# Patient Record
Sex: Male | Born: 1985 | Race: White | Hispanic: No | Marital: Single | Smoking: Current every day smoker
Health system: Southern US, Community
[De-identification: ages and names within clinical notes are randomized; demographics above are authoritative.]

## PROBLEM LIST (undated history)

## (undated) DIAGNOSIS — H5711 Ocular pain, right eye: Secondary | ICD-10-CM

## (undated) DIAGNOSIS — G43909 Migraine, unspecified, not intractable, without status migrainosus: Secondary | ICD-10-CM

## (undated) DIAGNOSIS — H538 Other visual disturbances: Secondary | ICD-10-CM

## (undated) HISTORY — DX: Ocular pain, right eye: H57.11

## (undated) HISTORY — PX: OTHER SURGICAL HISTORY: SHX169

## (undated) HISTORY — DX: Migraine, unspecified, not intractable, without status migrainosus: G43.909

## (undated) HISTORY — DX: Other visual disturbances: H53.8

---
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
## 2006-04-01 ENCOUNTER — Emergency Department (HOSPITAL_COMMUNITY): Admission: EM | Admit: 2006-04-01 | Discharge: 2006-04-02 | Payer: Self-pay | Admitting: Emergency Medicine

## 2007-05-17 ENCOUNTER — Emergency Department (HOSPITAL_COMMUNITY): Admission: EM | Admit: 2007-05-17 | Discharge: 2007-05-17 | Payer: Self-pay | Admitting: Emergency Medicine

## 2008-11-26 ENCOUNTER — Ambulatory Visit (HOSPITAL_COMMUNITY): Admission: RE | Admit: 2008-11-26 | Discharge: 2008-11-26 | Payer: Self-pay | Admitting: Family Medicine

## 2010-09-23 ENCOUNTER — Emergency Department (HOSPITAL_COMMUNITY)
Admission: EM | Admit: 2010-09-23 | Discharge: 2010-09-23 | Disposition: A | Discharge status: Home / Self Care | Payer: Self-pay | Attending: Emergency Medicine | Admitting: Emergency Medicine

## 2010-09-23 DIAGNOSIS — IMO0002 Reserved for concepts with insufficient information to code with codable children: Secondary | ICD-10-CM | POA: Insufficient documentation

## 2010-09-23 DIAGNOSIS — F17200 Nicotine dependence, unspecified, uncomplicated: Secondary | ICD-10-CM | POA: Insufficient documentation

## 2010-09-25 ENCOUNTER — Emergency Department (HOSPITAL_COMMUNITY)
Admission: EM | Admit: 2010-09-25 | Discharge: 2010-09-25 | Disposition: A | Discharge status: Home / Self Care | Payer: Self-pay | Attending: Emergency Medicine | Admitting: Emergency Medicine

## 2010-09-25 DIAGNOSIS — Z4800 Encounter for change or removal of nonsurgical wound dressing: Secondary | ICD-10-CM | POA: Insufficient documentation

## 2010-09-25 DIAGNOSIS — IMO0002 Reserved for concepts with insufficient information to code with codable children: Secondary | ICD-10-CM | POA: Insufficient documentation

## 2011-02-01 LAB — COMPREHENSIVE METABOLIC PANEL
ALT: 15
Alkaline Phosphatase: 68
BUN: 11
CO2: 29
Calcium: 9.3
GFR calc non Af Amer: >60
Glucose, Bld: 134 — ABNORMAL HIGH
Potassium: 4.2
Sodium: 138
Total Protein: 6.7

## 2011-02-01 LAB — LIPASE, BLOOD: Lipase: 43

## 2011-10-23 ENCOUNTER — Emergency Department (HOSPITAL_COMMUNITY): Payer: BC Managed Care – PPO

## 2011-10-23 ENCOUNTER — Encounter (HOSPITAL_COMMUNITY): Payer: Self-pay | Admitting: *Deleted

## 2011-10-23 ENCOUNTER — Emergency Department (HOSPITAL_COMMUNITY)
Admission: EM | Admit: 2011-10-23 | Discharge: 2011-10-23 | Disposition: A | Discharge status: Home / Self Care | Payer: BC Managed Care – PPO | Attending: Emergency Medicine | Admitting: Emergency Medicine

## 2011-10-23 DIAGNOSIS — F17200 Nicotine dependence, unspecified, uncomplicated: Secondary | ICD-10-CM | POA: Insufficient documentation

## 2011-10-23 DIAGNOSIS — R1013 Epigastric pain: Secondary | ICD-10-CM | POA: Insufficient documentation

## 2011-10-23 DIAGNOSIS — R109 Unspecified abdominal pain: Secondary | ICD-10-CM

## 2011-10-23 LAB — CBC
HCT: 45.6 % (ref 39.0–52.0)
MCHC: 34.4 g/dL (ref 30.0–36.0)
RDW: 13.1 % (ref 11.5–15.5)
WBC: 8.1 10*3/uL (ref 4.0–10.5)

## 2011-10-23 LAB — DIFFERENTIAL
Basophils Absolute: 0 10*3/uL (ref 0.0–0.1)
Basophils Relative: 0 % (ref 0–1)
Lymphocytes Relative: 30 % (ref 12–46)
Monocytes Absolute: 0.8 10*3/uL (ref 0.1–1.0)
Neutro Abs: 4.7 10*3/uL (ref 1.7–7.7)
Neutrophils Relative %: 58 % (ref 43–77)

## 2011-10-23 LAB — COMPREHENSIVE METABOLIC PANEL
ALT: 14 U/L (ref 0–53)
AST: 18 U/L (ref 0–37)
Albumin: 4.1 g/dL (ref 3.5–5.2)
Alkaline Phosphatase: 72 U/L (ref 39–117)
CO2: 26 mEq/L (ref 19–32)
Chloride: 101 mEq/L (ref 96–112)
GFR calc non Af Amer: >90 mL/min (ref >90)
Potassium: 4.1 mEq/L (ref 3.5–5.1)
Sodium: 136 mEq/L (ref 135–145)
Total Bilirubin: 0.4 mg/dL (ref 0.3–1.2)

## 2011-10-23 MED ORDER — GI COCKTAIL ~~LOC~~
30.0000 mL | Freq: Once | ORAL | Status: AC
  Administered 2011-10-23: 30 mL via ORAL
  Filled 2011-10-23: qty 30

## 2011-10-23 MED ORDER — PANTOPRAZOLE SODIUM 20 MG PO TBEC: 20.0000 mg | DELAYED_RELEASE_TABLET | Freq: Every day | ORAL | Status: DC

## 2011-10-23 NOTE — Discharge Instructions (Signed)
No caffeine,  No alcohol, no cigarettes,   Follow up for recheck in 1-2 weeks.  Follow up with either dr. Haskell Riling, or Dr. Janna Arch

## 2011-10-23 NOTE — ED Notes (Signed)
Pt presents with epigastric pain x 2 months that has significantly worsened over past 4 days. Pain refered to as severe at times that doubles him over, however he can "work through it " at times. Pt is sitting in bedside chair at this time. NAD noted.  No prior history of abdominal pain.  Pt denies N/V and fever.

## 2011-10-23 NOTE — ED Notes (Signed)
Patient transported to X-ray via wheelchair 

## 2011-10-23 NOTE — ED Notes (Signed)
Upper abd pain for 1 year.constant for 3 days. Nausea /vomiting on Friday. With diarrhea.    Now only nausea.  Headache.

## 2011-10-23 NOTE — ED Provider Notes (Signed)
History  This chart was scribed for Sergio Lennert, MD by Sergio Richardson. This patient was seen in room APA09/APA09 and the patient's care was started at 12:34PM.  CSN: [id number]  Arrival date & time 10/23/11  1127   First MD Initiated Contact with Patient 10/23/11 1234      Chief Complaint  Patient presents with  . Abdominal Pain    Patient is a 26 y.o. male presenting with abdominal pain. The history is provided by the patient. No language interpreter was used.  Abdominal Pain The primary symptoms of the illness include abdominal pain, nausea (Resolved), vomiting (Resolved) and diarrhea (Resolved). The primary symptoms of the illness do not include fatigue. The current episode started more than 2 days ago. The onset of the illness was gradual. The problem has been gradually worsening.  The abdominal pain is located in the epigastric region. The abdominal pain does not radiate. The severity of the abdominal pain is 10/10.  Symptoms associated with the illness do not include hematuria, frequency or back pain. Significant associated medical issues do not include GERD, inflammatory bowel disease or diabetes.    Sergio Richardson is a 26 y.o. male who presents to the Emergency Department complaining of 3 days of gradual onset, gradual worsening constant epigastric pain described as irritation. He states that the intensity fluctuates between mild and severe.  He reports prior episodes of similar symptoms since last year. He states that they come one a couple of times every three months and  last between 30 minutes to one hour at a time before resolving on their own. He states that this episode is more severe then last time. He reports that distracting himself with physical tasks improves the pain. He denies modifying factors that aggravate the pain. He denies that eating improves or worsens the pain. He reports that he has taken acid reducers and Zantac without improvement in his symptoms. He denies  trying ibuprofen or tylenol for his pain. He denies having any known causes. He has not seen a MD prior to today's visit for the pain. He denies any major abdominal surgeries. He reports one episode of nausea, emesis, and diarrhea 4 days ago but states that these symptoms have reolved. He also c/o HAs located in the right temple that he states are brought on by the epigastric pain. He states that bright lights and loud noises make the HAs worse. He reports that laying down and "sleeping it off" improves the HAs. He reports taking Excedrin and goody powders with moderate improvement in the HA. He denies fever, cough and congestion as associated symptoms. He denies having a h/o chronic medical conditions. He is a current everyday smoker and occasional alcohol user.  History reviewed. No pertinent past medical history.  History reviewed. No pertinent past surgical history.  Family History  Problem Relation Age of Onset  . Diabetes Brother     History  Substance Use Topics  . Smoking status: Current Everyday Smoker-0.5 pack per day  . Smokeless tobacco: Not on file  . Alcohol Use: Yes- weekends       Review of Systems  Constitutional: Negative for fatigue.  HENT: Negative for congestion, sinus pressure and ear discharge.   Eyes: Negative for discharge.  Respiratory: Negative for cough.   Cardiovascular: Negative for chest pain.  Gastrointestinal: Positive for nausea (Resolved), vomiting (Resolved), abdominal pain and diarrhea (Resolved).  Genitourinary: Negative for frequency and hematuria.  Musculoskeletal: Negative for back pain.  Skin: Negative for rash.  Neurological: Positive for headaches. Negative for seizures.  Hematological: Negative.   Psychiatric/Behavioral: Negative for hallucinations.    Allergies  Review of patient's allergies indicates no known allergies.  Home Medications   Current Outpatient Rx  Name Route Sig Dispense Refill  . ASPIRIN-ACETAMINOPHEN-CAFFEINE  500-325-65 MG PO PACK Oral Take 1 packet by mouth every 6 (six) hours as needed. For pain      Triage Vitals: BP 150/95  Pulse 99  Temp(Src) 98.1 F (36.7 C) (Oral)  Resp 18  Ht 6\' 2"  (1.88 m)  Wt 183 lb (83.008 kg)  BMI 23.50 kg/m2  SpO2 98%  Physical Exam  Nursing note and vitals reviewed. Constitutional: He is oriented to person, place, and time. He appears well-developed.  HENT:  Head: Normocephalic and atraumatic.  Eyes: Conjunctivae and EOM are normal. No scleral icterus.  Neck: Neck supple. No thyromegaly present.  Cardiovascular: Normal rate and regular rhythm.  Exam reveals no gallop and no friction rub.   No murmur heard. Pulmonary/Chest: No stridor. He has no wheezes. He has no rales. He exhibits no tenderness.  Abdominal: Soft. He exhibits no distension. There is tenderness (mild epigastric tenderness). There is no rebound.  Musculoskeletal: Normal range of motion. He exhibits no edema.  Lymphadenopathy:    He has no cervical adenopathy.  Neurological: He is oriented to person, place, and time. Coordination normal.  Skin: No rash noted. No erythema.  Psychiatric: He has a normal mood and affect. His behavior is normal.    ED Course  Procedures (including critical care time)  DIAGNOSTIC STUDIES: Oxygen Saturation is 98% on room air, normal by my interpretation.    COORDINATION OF CARE: 12:41PM-Discussed treatment plan which includes x-ray and blood work with pt and pt agreed to plan.   Labs Reviewed - No data to display No results found.   No diagnosis found.    MDM   The chart was scribed for me under my direct supervision.  I personally performed the history, physical, and medical decision making and all procedures in the evaluation of this patient.Sergio Lennert, MD 10/23/11 1504

## 2012-01-23 ENCOUNTER — Emergency Department (HOSPITAL_COMMUNITY): Payer: BC Managed Care – PPO

## 2012-01-23 ENCOUNTER — Emergency Department (HOSPITAL_COMMUNITY)
Admission: EM | Admit: 2012-01-23 | Discharge: 2012-01-23 | Disposition: A | Discharge status: Home / Self Care | Payer: BC Managed Care – PPO | Attending: Emergency Medicine | Admitting: Emergency Medicine

## 2012-01-23 ENCOUNTER — Encounter (HOSPITAL_COMMUNITY): Payer: Self-pay

## 2012-01-23 DIAGNOSIS — E119 Type 2 diabetes mellitus without complications: Secondary | ICD-10-CM | POA: Insufficient documentation

## 2012-01-23 DIAGNOSIS — F17200 Nicotine dependence, unspecified, uncomplicated: Secondary | ICD-10-CM | POA: Insufficient documentation

## 2012-01-23 DIAGNOSIS — M25569 Pain in unspecified knee: Secondary | ICD-10-CM | POA: Insufficient documentation

## 2012-01-23 DIAGNOSIS — M25562 Pain in left knee: Secondary | ICD-10-CM

## 2012-01-23 NOTE — ED Provider Notes (Signed)
History  This chart was scribed for Donnetta Hutching, MD by Shari Heritage. The patient was seen in room APA03/APA03. Patient's care was started at 0723.     CSN: [id number]  Arrival date & time 01/23/12  4098   First MD Initiated Contact with Patient 01/23/12 [phone]      Chief Complaint  Patient presents with  . Knee Pain    The history is provided by the patient. No language interpreter was used.   Sergio Richardson is a 26 y.o. male who presents to the Emergency Department complaining of constant, moderate, non-radiating left knee pain resulting from an injury sustained while playing softball 3 days ago. Patient injured his knee while diving for a ball. He states that pain worsens while walking and climbing stairs. Patient reports no other significant pain or symptoms. 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 He reports no other significant medical or surgical history. He is a current everyday smoker.   Family History  Problem Relation Age of Onset  . Diabetes Brother     History  Substance Use Topics  . Smoking status: Current Every Day Smoker  . Smokeless tobacco: Not on file  . Alcohol Use: Yes      Review of Systems A complete 10 system review of systems was obtained and all systems are negative except as noted in the HPI and PMH.   Allergies  Review of patient's allergies indicates no known allergies.  Home Medications   Current Outpatient Rx  Name Route Sig Dispense Refill  . ASPIRIN-ACETAMINOPHEN-CAFFEINE 500-325-65 MG PO PACK Oral Take 1 packet by mouth every 6 (six) hours as needed. For pain    . PANTOPRAZOLE SODIUM 20 MG PO TBEC Oral Take 1 tablet (20 mg total) by mouth daily. 30 tablet 1    BP 131/83  Pulse 71  Temp 98 F (36.7 C) (Oral)  Resp 18  Ht 6\' 2"  (1.88 m)  Wt 185 lb (83.915 kg)  BMI 23.75 kg/m2  SpO2 98%  Physical Exam  Nursing note and vitals reviewed. Constitutional: He is oriented to person,  place, and time. He appears well-developed and well-nourished.  HENT:  Head: Normocephalic and atraumatic.  Eyes: Conjunctivae normal and EOM are normal. Pupils are equal, round, and reactive to light.  Musculoskeletal: Normal range of motion.       Left upper leg: He exhibits tenderness.       Superior lateral aspect of knee is most tender. Pain is worse with flexion.  Neurological: He is alert and oriented to person, place, and time.  Skin: Skin is warm and dry.  Psychiatric: He has a normal mood and affect.    ED Course  Procedures (including critical care time) DIAGNOSTIC STUDIES: Oxygen Saturation is 98% on room air, normal by my interpretation.    COORDINATION OF CARE: 7:31am- Patient informed of current plan for treatment and evaluation and agrees with plan at this time. Will refer patient to on-call orthopedic surgeon for follow-up. Instructed patient to rest knee and apply ice.  Dg Knee Complete 4 Views Left  01/23/2012  *RADIOLOGY REPORT*  Clinical Data: Left-sided knee pain.  LEFT KNEE - COMPLETE 4+ VIEW  Comparison: No priors.  Findings: Four views of the left knee demonstrate no acute fracture, subluxation or dislocation.  There is some mild prepatellar soft tissue swelling.  IMPRESSION: 1.  Mild prepatellar soft tissue swelling without evidence of underlying bony trauma.   Original Report  Authenticated By: Florencia Reasons, M.D.      No diagnosis found.    MDM  X-ray shows no fracture. Ice, elevate, knee brace, refer to orthopedic surgeon. Possibility of MRI discussed.       I personally performed the services described in this documentation, which was scribed in my presence. The recorded information has been reviewed and considered.    Donnetta Hutching, MD 01/23/12 [phone]

## 2012-01-23 NOTE — ED Notes (Signed)
Pt reports was playing softball Sunday and dove for a ball.  Pt c/o pain to left knee.  Abrasion noted.

## 2012-11-27 ENCOUNTER — Emergency Department (HOSPITAL_COMMUNITY)
Admission: EM | Admit: 2012-11-27 | Discharge: 2012-11-27 | Disposition: A | Discharge status: Home / Self Care | Payer: BC Managed Care – PPO | Attending: Emergency Medicine | Admitting: Emergency Medicine

## 2012-11-27 ENCOUNTER — Encounter (HOSPITAL_COMMUNITY): Payer: Self-pay | Admitting: *Deleted

## 2012-11-27 DIAGNOSIS — R112 Nausea with vomiting, unspecified: Secondary | ICD-10-CM | POA: Insufficient documentation

## 2012-11-27 DIAGNOSIS — G8921 Chronic pain due to trauma: Secondary | ICD-10-CM | POA: Insufficient documentation

## 2012-11-27 DIAGNOSIS — R220 Localized swelling, mass and lump, head: Secondary | ICD-10-CM | POA: Insufficient documentation

## 2012-11-27 DIAGNOSIS — F17200 Nicotine dependence, unspecified, uncomplicated: Secondary | ICD-10-CM | POA: Insufficient documentation

## 2012-11-27 MED ORDER — DEXAMETHASONE SODIUM PHOSPHATE 4 MG/ML IJ SOLN
10.0000 mg | Freq: Once | INTRAMUSCULAR | Status: AC
  Administered 2012-11-27: 10 mg via INTRAVENOUS
  Filled 2012-11-27: qty 3

## 2012-11-27 MED ORDER — SODIUM CHLORIDE 0.9 % IV SOLN
1000.0000 mL | Freq: Once | INTRAVENOUS | Status: AC
  Administered 2012-11-27: 1000 mL via INTRAVENOUS

## 2012-11-27 MED ORDER — SODIUM CHLORIDE 0.9 % IV SOLN
1000.0000 mL | INTRAVENOUS | Status: DC
  Administered 2012-11-27: 19:00:00 via INTRAVENOUS

## 2012-11-27 MED ORDER — METOCLOPRAMIDE HCL 5 MG/ML IJ SOLN
10.0000 mg | Freq: Once | INTRAMUSCULAR | Status: AC
  Administered 2012-11-27: 10 mg via INTRAVENOUS
  Filled 2012-11-27: qty 2

## 2012-11-27 MED ORDER — DIPHENHYDRAMINE HCL 50 MG/ML IJ SOLN
25.0000 mg | Freq: Once | INTRAMUSCULAR | Status: AC
  Administered 2012-11-27: 25 mg via INTRAVENOUS
  Filled 2012-11-27: qty 1

## 2012-11-27 MED ORDER — ISOMETHEPTENE-APAP-DICHLORAL 65-325-100 MG PO CAPS: ORAL_CAPSULE | ORAL | Status: DC
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
## 2013-02-06 ENCOUNTER — Encounter: Payer: Self-pay | Admitting: Neurology

## 2013-02-06 ENCOUNTER — Ambulatory Visit (INDEPENDENT_AMBULATORY_CARE_PROVIDER_SITE_OTHER): Payer: BC Managed Care – PPO | Admitting: Neurology

## 2013-02-06 VITALS — BP 111/75 | HR 85 | Ht 72.0 in | Wt 201.0 lb

## 2013-02-06 DIAGNOSIS — G43909 Migraine, unspecified, not intractable, without status migrainosus: Secondary | ICD-10-CM | POA: Insufficient documentation

## 2013-02-06 MED ORDER — NORTRIPTYLINE HCL 10 MG PO CAPS: 20.0000 mg | ORAL_CAPSULE | Freq: Every day | ORAL | Status: DC

## 2013-02-06 MED ORDER — SUMATRIPTAN SUCCINATE 50 MG PO TABS: 50.0000 mg | ORAL_TABLET | ORAL | Status: DC | PRN
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
## 2013-04-02 ENCOUNTER — Encounter (HOSPITAL_COMMUNITY): Payer: Self-pay | Admitting: Emergency Medicine

## 2013-04-02 ENCOUNTER — Emergency Department (HOSPITAL_COMMUNITY)
Admission: EM | Admit: 2013-04-02 | Discharge: 2013-04-02 | Disposition: A | Discharge status: Home / Self Care | Payer: BC Managed Care – PPO | Attending: Emergency Medicine | Admitting: Emergency Medicine

## 2013-04-02 DIAGNOSIS — S335XXA Sprain of ligaments of lumbar spine, initial encounter: Secondary | ICD-10-CM | POA: Insufficient documentation

## 2013-04-02 DIAGNOSIS — Y9389 Activity, other specified: Secondary | ICD-10-CM | POA: Insufficient documentation

## 2013-04-02 DIAGNOSIS — Y929 Unspecified place or not applicable: Secondary | ICD-10-CM | POA: Insufficient documentation

## 2013-04-02 DIAGNOSIS — X500XXA Overexertion from strenuous movement or load, initial encounter: Secondary | ICD-10-CM | POA: Insufficient documentation

## 2013-04-02 DIAGNOSIS — F17200 Nicotine dependence, unspecified, uncomplicated: Secondary | ICD-10-CM | POA: Insufficient documentation

## 2013-04-02 DIAGNOSIS — G43909 Migraine, unspecified, not intractable, without status migrainosus: Secondary | ICD-10-CM | POA: Insufficient documentation

## 2013-04-02 DIAGNOSIS — S3992XA Unspecified injury of lower back, initial encounter: Secondary | ICD-10-CM

## 2013-04-02 MED ORDER — CYCLOBENZAPRINE HCL 10 MG PO TABS
10.0000 mg | ORAL_TABLET | Freq: Once | ORAL | Status: AC
  Administered 2013-04-02: 10 mg via ORAL
  Filled 2013-04-02: qty 1

## 2013-04-02 MED ORDER — CYCLOBENZAPRINE HCL 10 MG PO TABS: 10.0000 mg | ORAL_TABLET | Freq: Two times a day (BID) | ORAL | Status: DC | PRN

## 2013-04-02 MED ORDER — IBUPROFEN 800 MG PO TABS
800.0000 mg | ORAL_TABLET | Freq: Once | ORAL | Status: AC
  Administered 2013-04-02: 800 mg via ORAL
  Filled 2013-04-02: qty 1

## 2013-04-02 MED ORDER — IBUPROFEN 600 MG PO TABS: 600.0000 mg | ORAL_TABLET | Freq: Four times a day (QID) | ORAL | Status: DC | PRN

## 2013-04-02 NOTE — ED Notes (Signed)
C/o lower back pain worse on right side after walking up a ladder and felt a spasm.
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
## 2013-04-03 NOTE — ED Provider Notes (Signed)
  Medical screening examination/treatment/procedure(s) were performed by non-physician practitioner and as supervising physician I was immediately available for consultation/collaboration.      Gerhard Munch, MD 04/03/13 [phone]

## 2013-04-07 ENCOUNTER — Ambulatory Visit: Payer: BC Managed Care – PPO | Admitting: Nurse Practitioner

## 2015-09-06 ENCOUNTER — Encounter: Payer: Self-pay | Admitting: Family Medicine

## 2015-09-06 ENCOUNTER — Encounter: Payer: Self-pay | Admitting: *Deleted

## 2015-09-06 ENCOUNTER — Encounter (INDEPENDENT_AMBULATORY_CARE_PROVIDER_SITE_OTHER): Payer: Self-pay

## 2015-09-06 ENCOUNTER — Ambulatory Visit (INDEPENDENT_AMBULATORY_CARE_PROVIDER_SITE_OTHER): Payer: 59 | Admitting: Family Medicine

## 2015-09-06 VITALS — BP 112/71 | HR 74 | Temp 98.0°F | Ht 74.0 in | Wt 211.4 lb

## 2015-09-06 DIAGNOSIS — G44041 Chronic paroxysmal hemicrania, intractable: Secondary | ICD-10-CM | POA: Diagnosis not present

## 2015-09-06 MED ORDER — FROVATRIPTAN SUCCINATE 2.5 MG PO TABS: 2.5000 mg | ORAL_TABLET | ORAL | Status: DC | PRN

## 2015-09-06 MED ORDER — TOPIRAMATE 25 MG PO TABS: ORAL_TABLET | ORAL | Status: DC
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
## 2015-09-07 LAB — CMP14+EGFR
ALK PHOS: 76 IU/L (ref 39–117)
ALT: 17 IU/L (ref 0–44)
AST: 18 IU/L (ref 0–40)
Albumin/Globulin Ratio: 1.8 (ref 1.2–2.2)
Albumin: 4.6 g/dL (ref 3.5–5.5)
BUN/Creatinine Ratio: 10 (ref 9–20)
BUN: 9 mg/dL (ref 6–20)
Bilirubin Total: 0.3 mg/dL (ref 0.0–1.2)
CALCIUM: 9.9 mg/dL (ref 8.7–10.2)
CO2: 26 mmol/L (ref 18–29)
CREATININE: 0.88 mg/dL (ref 0.76–1.27)
Chloride: 100 mmol/L (ref 96–106)
GFR calc Af Amer: 135 mL/min/{1.73_m2} (ref >59)
GFR, EST NON AFRICAN AMERICAN: 117 mL/min/{1.73_m2} (ref >59)
GLOBULIN, TOTAL: 2.6 g/dL (ref 1.5–4.5)
GLUCOSE: 87 mg/dL (ref 65–99)
Potassium: 4.8 mmol/L (ref 3.5–5.2)
Sodium: 142 mmol/L (ref 134–144)
Total Protein: 7.2 g/dL (ref 6.0–8.5)

## 2015-09-07 LAB — CBC WITH DIFFERENTIAL/PLATELET
BASOS: 0 %
Basophils Absolute: 0 10*3/uL (ref 0.0–0.2)
EOS (ABSOLUTE): 0.3 10*3/uL (ref 0.0–0.4)
EOS: 3 %
HEMATOCRIT: 49.7 % (ref 37.5–51.0)
HEMOGLOBIN: 16.9 g/dL (ref 12.6–17.7)
IMMATURE GRANS (ABS): 0 10*3/uL (ref 0.0–0.1)
IMMATURE GRANULOCYTES: 0 %
LYMPHS: 30 %
Lymphocytes Absolute: 3.4 10*3/uL — ABNORMAL HIGH (ref 0.7–3.1)
MCH: 30.5 pg (ref 26.6–33.0)
MCHC: 34 g/dL (ref 31.5–35.7)
MCV: 90 fL (ref 79–97)
MONOCYTES: 7 %
Monocytes Absolute: 0.8 10*3/uL (ref 0.1–0.9)
NEUTROS ABS: 7 10*3/uL (ref 1.4–7.0)
NEUTROS PCT: 60 %
Platelets: 297 10*3/uL (ref 150–379)
RBC: 5.54 x10E6/uL (ref 4.14–5.80)
RDW: 14.1 % (ref 12.3–15.4)
WBC: 11.5 10*3/uL — ABNORMAL HIGH (ref 3.4–10.8)

## 2015-09-07 LAB — SEDIMENTATION RATE: SED RATE: 2 mm/h (ref 0–15)

## 2015-09-07 LAB — TSH: TSH: 1.18 u[IU]/mL (ref 0.450–4.500)

## 2015-09-19 ENCOUNTER — Ambulatory Visit (HOSPITAL_COMMUNITY): Payer: 59

## 2015-10-25 ENCOUNTER — Other Ambulatory Visit: Payer: Self-pay | Admitting: Family Medicine

## 2015-10-25 MED ORDER — TOPIRAMATE 25 MG PO TABS: ORAL_TABLET | ORAL | Status: DC

## 2015-10-28 ENCOUNTER — Encounter: Payer: Self-pay | Admitting: Family Medicine

## 2016-08-13 ENCOUNTER — Ambulatory Visit: Payer: Self-pay | Admitting: Family Medicine

## 2016-08-14 ENCOUNTER — Encounter: Payer: Self-pay | Admitting: Family Medicine

## 2017-02-13 ENCOUNTER — Encounter (HOSPITAL_COMMUNITY): Payer: Self-pay | Admitting: Emergency Medicine

## 2017-02-13 ENCOUNTER — Emergency Department (HOSPITAL_COMMUNITY)
Admission: EM | Admit: 2017-02-13 | Discharge: 2017-02-13 | Disposition: A | Discharge status: Home / Self Care | Payer: 59 | Attending: Emergency Medicine | Admitting: Emergency Medicine

## 2017-02-13 DIAGNOSIS — F17210 Nicotine dependence, cigarettes, uncomplicated: Secondary | ICD-10-CM | POA: Insufficient documentation

## 2017-02-13 DIAGNOSIS — L509 Urticaria, unspecified: Secondary | ICD-10-CM | POA: Diagnosis not present

## 2017-02-13 DIAGNOSIS — T7840XA Allergy, unspecified, initial encounter: Secondary | ICD-10-CM | POA: Diagnosis present

## 2017-02-13 MED ORDER — FAMOTIDINE 20 MG PO TABS: 20.0000 mg | ORAL_TABLET | Freq: Two times a day (BID) | ORAL | 0 refills | Status: DC

## 2017-02-13 MED ORDER — FAMOTIDINE IN NACL 20-0.9 MG/50ML-% IV SOLN
20.0000 mg | Freq: Once | INTRAVENOUS | Status: AC
  Administered 2017-02-13: 20 mg via INTRAVENOUS
  Filled 2017-02-13: qty 50

## 2017-02-13 MED ORDER — METHYLPREDNISOLONE SODIUM SUCC 125 MG IJ SOLR
125.0000 mg | Freq: Once | INTRAMUSCULAR | Status: AC
  Administered 2017-02-13: 125 mg via INTRAVENOUS
  Filled 2017-02-13: qty 2

## 2017-02-13 MED ORDER — DIPHENHYDRAMINE HCL 50 MG/ML IJ SOLN
25.0000 mg | Freq: Once | INTRAMUSCULAR | Status: AC
  Administered 2017-02-13: 25 mg via INTRAVENOUS
  Filled 2017-02-13: qty 1

## 2017-02-13 MED ORDER — PREDNISONE 10 MG PO TABS: 40.0000 mg | ORAL_TABLET | Freq: Every day | ORAL | 0 refills | Status: DC

## 2017-02-13 MED ORDER — DIPHENHYDRAMINE HCL 25 MG PO TABS: 25.0000 mg | ORAL_TABLET | Freq: Four times a day (QID) | ORAL | 0 refills | Status: DC

## 2017-02-13 MED ORDER — SODIUM CHLORIDE 0.9 % IV SOLN
INTRAVENOUS | Status: DC
  Administered 2017-02-13: 22:00:00 via INTRAVENOUS

## 2017-02-13 MED ORDER — SODIUM CHLORIDE 0.9 % IV BOLUS (SEPSIS)
500.0000 mL | Freq: Once | INTRAVENOUS | Status: AC
  Administered 2017-02-13: 500 mL via INTRAVENOUS

## 2017-02-13 NOTE — ED Notes (Signed)
Patient ambulated to restroom without assistance. Not able to give a specimen at this time.

## 2017-02-13 NOTE — ED Notes (Signed)
EKG done in triage

## 2017-02-13 NOTE — Discharge Instructions (Signed)
Take medications as directed. Return for any new or worse symptoms. Work note provided.
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
## 2017-02-13 NOTE — ED Triage Notes (Signed)
Pt states that he was cutting up an onion and raw chicken.  Pt states that he is not experiencing shortness of breath, but is having some chest discomfort .  Pt is covered in hives, itching, and red.  Pt states that he feels like his lips are swelling.  He has not eaten anything differently nor has he used any different body washes or lotions.

## 2017-07-03 ENCOUNTER — Emergency Department (HOSPITAL_COMMUNITY): Payer: 59

## 2017-07-03 ENCOUNTER — Emergency Department (HOSPITAL_COMMUNITY)
Admission: EM | Admit: 2017-07-03 | Discharge: 2017-07-03 | Disposition: A | Discharge status: Home / Self Care | Payer: 59 | Attending: Emergency Medicine | Admitting: Emergency Medicine

## 2017-07-03 ENCOUNTER — Encounter (HOSPITAL_COMMUNITY): Payer: Self-pay

## 2017-07-03 ENCOUNTER — Other Ambulatory Visit: Payer: Self-pay

## 2017-07-03 DIAGNOSIS — F17210 Nicotine dependence, cigarettes, uncomplicated: Secondary | ICD-10-CM | POA: Diagnosis not present

## 2017-07-03 DIAGNOSIS — R112 Nausea with vomiting, unspecified: Secondary | ICD-10-CM

## 2017-07-03 DIAGNOSIS — R197 Diarrhea, unspecified: Secondary | ICD-10-CM | POA: Diagnosis not present

## 2017-07-03 LAB — COMPREHENSIVE METABOLIC PANEL
ALK PHOS: 70 U/L (ref 38–126)
ALT: 20 U/L (ref 17–63)
AST: 17 U/L (ref 15–41)
Albumin: 4.3 g/dL (ref 3.5–5.0)
Anion gap: 13 (ref 5–15)
BUN: 20 mg/dL (ref 6–20)
CALCIUM: 9.1 mg/dL (ref 8.9–10.3)
CHLORIDE: 105 mmol/L (ref 101–111)
CO2: 22 mmol/L (ref 22–32)
Creatinine, Ser: 0.94 mg/dL (ref 0.61–1.24)
GFR calc Af Amer: >60 mL/min (ref >60)
Glucose, Bld: 123 mg/dL — ABNORMAL HIGH (ref 65–99)
Potassium: 4 mmol/L (ref 3.5–5.1)
Sodium: 140 mmol/L (ref 135–145)
Total Bilirubin: 0.8 mg/dL (ref 0.3–1.2)
Total Protein: 7.7 g/dL (ref 6.5–8.1)

## 2017-07-03 LAB — CBC
HCT: 50.3 % (ref 39.0–52.0)
Hemoglobin: 16.9 g/dL (ref 13.0–17.0)
MCH: 31 pg (ref 26.0–34.0)
MCHC: 33.6 g/dL (ref 30.0–36.0)
MCV: 92.1 fL (ref 78.0–100.0)
PLATELETS: 246 10*3/uL (ref 150–400)
RBC: 5.46 MIL/uL (ref 4.22–5.81)
RDW: 13.2 % (ref 11.5–15.5)
WBC: 16.7 10*3/uL — AB (ref 4.0–10.5)

## 2017-07-03 LAB — LIPASE, BLOOD: LIPASE: 37 U/L (ref 11–51)

## 2017-07-03 MED ORDER — PROMETHAZINE HCL 25 MG RE SUPP: 25.0000 mg | Freq: Four times a day (QID) | RECTAL | 0 refills | Status: DC | PRN

## 2017-07-03 MED ORDER — ONDANSETRON HCL 4 MG/2ML IJ SOLN
INTRAMUSCULAR | Status: AC
  Administered 2017-07-03: 4 mg via INTRAVENOUS
  Filled 2017-07-03: qty 2

## 2017-07-03 MED ORDER — SODIUM CHLORIDE 0.9 % IV BOLUS (SEPSIS)
1000.0000 mL | Freq: Once | INTRAVENOUS | Status: AC
  Administered 2017-07-03: 1000 mL via INTRAVENOUS

## 2017-07-03 MED ORDER — ONDANSETRON 4 MG PO TBDP
ORAL_TABLET | ORAL | Status: AC
  Administered 2017-07-03: 4 mg
  Filled 2017-07-03: qty 1

## 2017-07-03 MED ORDER — PROMETHAZINE HCL 25 MG/ML IJ SOLN
25.0000 mg | Freq: Once | INTRAMUSCULAR | Status: AC
  Administered 2017-07-03: 25 mg via INTRAVENOUS
  Filled 2017-07-03: qty 1

## 2017-07-03 MED ORDER — PROMETHAZINE HCL 25 MG PO TABS: 25.0000 mg | ORAL_TABLET | Freq: Four times a day (QID) | ORAL | 0 refills | Status: DC | PRN

## 2017-07-03 MED ORDER — ONDANSETRON HCL 4 MG PO TABS
4.0000 mg | ORAL_TABLET | Freq: Once | ORAL | Status: AC
  Administered 2017-07-03: 4 mg via ORAL

## 2017-07-03 MED ORDER — ONDANSETRON HCL 4 MG/2ML IJ SOLN
4.0000 mg | Freq: Once | INTRAMUSCULAR | Status: AC
  Administered 2017-07-03: 4 mg via INTRAVENOUS

## 2017-07-03 NOTE — ED Notes (Signed)
Pt was informed that we need a urine sample. Pt states he can not urinate at this time.
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
## 2017-07-03 NOTE — ED Triage Notes (Signed)
Pt has been throwing up for the last 3 hours. Can't keep anything down. States he also has diarrhea. Pt has centralized abdominal pain. Pt in NAD

## 2017-07-03 NOTE — ED Notes (Addendum)
Medications were given prior to 6pm not by this RN.

## 2017-07-31 ENCOUNTER — Ambulatory Visit: Payer: 59 | Admitting: Family Medicine

## 2019-02-03 ENCOUNTER — Other Ambulatory Visit: Payer: Self-pay

## 2019-02-03 DIAGNOSIS — Z20822 Contact with and (suspected) exposure to covid-19: Secondary | ICD-10-CM

## 2019-02-04 LAB — NOVEL CORONAVIRUS, NAA: SARS-CoV-2, NAA: NOT DETECTED

## 2019-05-19 ENCOUNTER — Other Ambulatory Visit: Payer: Self-pay

## 2019-05-19 ENCOUNTER — Ambulatory Visit: Payer: Managed Care, Other (non HMO) | Attending: Internal Medicine

## 2019-05-19 DIAGNOSIS — Z20822 Contact with and (suspected) exposure to covid-19: Secondary | ICD-10-CM

## 2019-05-21 LAB — NOVEL CORONAVIRUS, NAA: SARS-CoV-2, NAA: NOT DETECTED

## 2020-05-31 ENCOUNTER — Encounter: Payer: Self-pay | Admitting: Orthopaedic Surgery

## 2020-05-31 ENCOUNTER — Ambulatory Visit: Payer: BC Managed Care – PPO

## 2020-05-31 ENCOUNTER — Ambulatory Visit (INDEPENDENT_AMBULATORY_CARE_PROVIDER_SITE_OTHER): Payer: BC Managed Care – PPO | Admitting: Orthopaedic Surgery

## 2020-05-31 ENCOUNTER — Other Ambulatory Visit: Payer: Self-pay

## 2020-05-31 ENCOUNTER — Telehealth: Payer: Self-pay | Admitting: Radiology

## 2020-05-31 VITALS — BP 143/95 | HR 109 | Ht 73.5 in | Wt 211.0 lb

## 2020-05-31 DIAGNOSIS — M25551 Pain in right hip: Secondary | ICD-10-CM

## 2020-05-31 MED ORDER — NAPROXEN 500 MG PO TABS: 500.0000 mg | ORAL_TABLET | Freq: Two times a day (BID) | ORAL | 5 refills | Status: DC
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
## 2020-05-31 NOTE — Telephone Encounter (Signed)
Patient's fiance called asking about a work note/work status for patient.    Patient missed work today, and wants to know if you recommend him stay out of work beyond that?   Please advise on work note to write for patient, thanks.

## 2020-05-31 NOTE — Telephone Encounter (Signed)
OOW note for today written for patient.  He will discuss with employer and let us know if further OOW note needed.

## 2020-05-31 NOTE — Progress Notes (Signed)
dg 

## 2020-05-31 NOTE — Patient Instructions (Signed)

## 2020-05-31 NOTE — Telephone Encounter (Signed)
It is up to him.  He can stay out if he desires but needs MRI.  He can talk to his boss to see what limits can be arranged.

## 2020-06-03 ENCOUNTER — Telehealth: Payer: Self-pay | Admitting: Radiology

## 2020-06-03 NOTE — Telephone Encounter (Signed)
Patient emailed asking for a work note.  Please see below.    Good afternoon, I tried going to work on Wednesday and definitely over did it. I was out of work Thursday because of it and couldn't walk. My job is requesting a dr note for that absence and wanted to ask if that was possible. Thank you for your time.  Sergio Richardson   Please advise if ok to write work note for Thursday 06/02/20.

## 2020-06-06 ENCOUNTER — Encounter: Payer: Self-pay | Admitting: Orthopaedic Surgery

## 2020-06-16 ENCOUNTER — Other Ambulatory Visit: Payer: Self-pay | Admitting: Orthopaedic Surgery

## 2020-06-16 DIAGNOSIS — M25551 Pain in right hip: Secondary | ICD-10-CM

## 2020-06-16 NOTE — Progress Notes (Signed)
Order was previously placed for with and without and per the note patent only needs an MRI without per the provider note. The order was changed with Radiology.

## 2020-06-16 NOTE — Progress Notes (Signed)
Since the order is changed to without contrast will this need a new pre-certification since the original order was with and without.

## 2020-06-17 ENCOUNTER — Other Ambulatory Visit: Payer: Self-pay

## 2020-06-17 ENCOUNTER — Ambulatory Visit (HOSPITAL_COMMUNITY)
Admission: RE | Admit: 2020-06-17 | Discharge: 2020-06-17 | Disposition: A | Discharge status: Home / Self Care | Payer: BC Managed Care – PPO | Source: Ambulatory Visit | Attending: Orthopaedic Surgery | Admitting: Orthopaedic Surgery

## 2020-06-17 ENCOUNTER — Ambulatory Visit: Payer: BC Managed Care – PPO | Admitting: Orthopedic Surgery

## 2020-06-17 DIAGNOSIS — M25551 Pain in right hip: Secondary | ICD-10-CM | POA: Diagnosis present

## 2020-06-24 ENCOUNTER — Ambulatory Visit (INDEPENDENT_AMBULATORY_CARE_PROVIDER_SITE_OTHER): Payer: BC Managed Care – PPO | Admitting: Orthopedic Surgery

## 2020-06-24 ENCOUNTER — Encounter: Payer: Self-pay | Admitting: Orthopedic Surgery

## 2020-06-24 ENCOUNTER — Other Ambulatory Visit: Payer: Self-pay

## 2020-06-24 VITALS — BP 135/85 | HR 99 | Ht 73.0 in | Wt 215.5 lb

## 2020-06-24 DIAGNOSIS — M25851 Other specified joint disorders, right hip: Secondary | ICD-10-CM

## 2020-06-24 NOTE — Patient Instructions (Signed)
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
## 2020-07-08 ENCOUNTER — Ambulatory Visit (HOSPITAL_COMMUNITY): Payer: BC Managed Care – PPO

## 2020-07-22 ENCOUNTER — Ambulatory Visit (HOSPITAL_COMMUNITY): Payer: BC Managed Care – PPO | Attending: Orthopedic Surgery | Admitting: Physical Therapy

## 2022-01-01 ENCOUNTER — Encounter (HOSPITAL_COMMUNITY): Payer: Self-pay

## 2022-01-01 ENCOUNTER — Other Ambulatory Visit: Payer: Self-pay

## 2022-01-01 ENCOUNTER — Emergency Department (HOSPITAL_COMMUNITY)
Admission: EM | Admit: 2022-01-01 | Discharge: 2022-01-01 | Disposition: A | Discharge status: Home / Self Care | Payer: BC Managed Care – PPO | Attending: Emergency Medicine | Admitting: Emergency Medicine

## 2022-01-01 ENCOUNTER — Emergency Department (HOSPITAL_COMMUNITY): Payer: BC Managed Care – PPO

## 2022-01-01 DIAGNOSIS — L239 Allergic contact dermatitis, unspecified cause: Secondary | ICD-10-CM | POA: Diagnosis not present

## 2022-01-01 DIAGNOSIS — R1013 Epigastric pain: Secondary | ICD-10-CM | POA: Diagnosis not present

## 2022-01-01 DIAGNOSIS — D45 Polycythemia vera: Secondary | ICD-10-CM | POA: Diagnosis not present

## 2022-01-01 DIAGNOSIS — D751 Secondary polycythemia: Secondary | ICD-10-CM

## 2022-01-01 DIAGNOSIS — T7840XA Allergy, unspecified, initial encounter: Secondary | ICD-10-CM | POA: Diagnosis present

## 2022-01-01 LAB — COMPREHENSIVE METABOLIC PANEL
ALT: 26 U/L (ref 0–44)
AST: 21 U/L (ref 15–41)
Albumin: 4.1 g/dL (ref 3.5–5.0)
Alkaline Phosphatase: 75 U/L (ref 38–126)
Anion gap: 9 (ref 5–15)
BUN: 15 mg/dL (ref 6–20)
CO2: 24 mmol/L (ref 22–32)
Calcium: 9 mg/dL (ref 8.9–10.3)
Chloride: 103 mmol/L (ref 98–111)
Creatinine, Ser: 0.9 mg/dL (ref 0.61–1.24)
GFR, Estimated: >60 mL/min (ref >60)
Glucose, Bld: 116 mg/dL — ABNORMAL HIGH (ref 70–99)
Potassium: 4 mmol/L (ref 3.5–5.1)
Sodium: 136 mmol/L (ref 135–145)
Total Bilirubin: 0.3 mg/dL (ref 0.3–1.2)
Total Protein: 7.7 g/dL (ref 6.5–8.1)

## 2022-01-01 LAB — CBC
HCT: 57.3 % — ABNORMAL HIGH (ref 39.0–52.0)
Hemoglobin: 19.2 g/dL — ABNORMAL HIGH (ref 13.0–17.0)
MCH: 31 pg (ref 26.0–34.0)
MCHC: 33.5 g/dL (ref 30.0–36.0)
MCV: 92.4 fL (ref 80.0–100.0)
Platelets: 339 10*3/uL (ref 150–400)
RBC: 6.2 MIL/uL — ABNORMAL HIGH (ref 4.22–5.81)
RDW: 13.7 % (ref 11.5–15.5)
WBC: 9 10*3/uL (ref 4.0–10.5)
nRBC: 0 % (ref 0.0–0.2)

## 2022-01-01 LAB — LIPASE, BLOOD: Lipase: 40 U/L (ref 11–51)

## 2022-01-01 LAB — TROPONIN I (HIGH SENSITIVITY): Troponin I (High Sensitivity): <2 ng/L (ref <18)

## 2022-01-01 MED ORDER — IOHEXOL 300 MG/ML  SOLN
100.0000 mL | Freq: Once | INTRAMUSCULAR | Status: AC | PRN
  Administered 2022-01-01: 100 mL via INTRAVENOUS

## 2022-01-01 MED ORDER — PANTOPRAZOLE SODIUM 40 MG PO TBEC: 40.0000 mg | DELAYED_RELEASE_TABLET | Freq: Every day | ORAL | 0 refills | Status: AC

## 2022-01-01 MED ORDER — ALUM & MAG HYDROXIDE-SIMETH 200-200-20 MG/5ML PO SUSP
30.0000 mL | Freq: Once | ORAL | Status: AC
  Administered 2022-01-01: 30 mL via ORAL
  Filled 2022-01-01: qty 30

## 2022-01-01 MED ORDER — FAMOTIDINE IN NACL 20-0.9 MG/50ML-% IV SOLN
20.0000 mg | Freq: Once | INTRAVENOUS | Status: AC
  Administered 2022-01-01: 20 mg via INTRAVENOUS
  Filled 2022-01-01: qty 50

## 2022-01-01 MED ORDER — METHYLPREDNISOLONE SODIUM SUCC 125 MG IJ SOLR
125.0000 mg | Freq: Once | INTRAMUSCULAR | Status: AC
  Administered 2022-01-01: 125 mg via INTRAVENOUS
  Filled 2022-01-01: qty 2

## 2022-01-01 MED ORDER — HYDROMORPHONE HCL 1 MG/ML IJ SOLN
0.5000 mg | Freq: Once | INTRAMUSCULAR | Status: AC
  Administered 2022-01-01: 0.5 mg via INTRAVENOUS
  Filled 2022-01-01: qty 0.5

## 2022-01-01 NOTE — ED Notes (Signed)
Pt d/c home with visitor per MD order. Discharge summary reviewed, pt verbalizes understanding. Ambulatory off unit. No s/s of acute distress noted at discharge.  

## 2022-01-01 NOTE — ED Triage Notes (Signed)
Patient also reports he has a secondary issue with ABD pain that started about one week ago

## 2022-01-01 NOTE — ED Provider Notes (Addendum)
[facility] EMERGENCY DEPARTMENT Provider Note   CSN: [id number] Arrival date & time: 01/01/22  2542     History  Chief Complaint  Patient presents with   Allergic Reaction    Sergio Richardson is a 36 y.o. male.  Patient c/o acute onset generalized rash/itching this AM. Felt fine when went to bed last night. Was generalized on bil upper and lower extremities and trunk. Notes hives earlier. Took benadryl 50 mg around 615 and notes symptoms improved but not resolved. Notes hx similar symptoms several years ago - resolved without specific dx then. Denies recent uri symptoms or viral illness. No change in any home or personal products. No known outdoor or chemical exposure. No recent new meds or change in meds. No change in foods or new foods. Also notes recent intermittent epigastric/ruq pain in past weeks - currently since last night, constant, dull, occurs at rest, not related to activity or exertion. No hx pud, pancreatitis or gallstones. Normal appetite. No back or flank pain, no chest pain or sob.   The history is provided by the patient, the spouse and medical records.  Allergic Reaction Presenting symptoms: rash   Presenting symptoms: no difficulty swallowing and no wheezing        Home Medications Prior to Admission medications   Medication Sig Start Date End Date Taking? Authorizing Provider  naproxen (NAPROSYN) 500 MG tablet Take 1 tablet (500 mg total) by mouth 2 (two) times daily with a meal. 05/31/20   Sanjuana Kava, MD  promethazine (PHENERGAN) 25 MG suppository Place 1 suppository (25 mg total) rectally every 6 (six) hours as needed for nausea or vomiting. Patient not taking: Reported on 05/31/2020 07/03/17   Mesner, Corene Cornea, MD  promethazine (PHENERGAN) 25 MG tablet Take 1 tablet (25 mg total) by mouth every 6 (six) hours as needed for nausea or vomiting. Patient not taking: Reported on 05/31/2020 07/03/17   Mesner, Corene Cornea, MD      Allergies    Patient has no known  allergies.    Review of Systems   Review of Systems  Constitutional:  Negative for chills and fever.  HENT:  Negative for sore throat and trouble swallowing.   Eyes:  Negative for redness.  Respiratory:  Negative for cough, shortness of breath and wheezing.   Cardiovascular:  Negative for chest pain.  Gastrointestinal:  Negative for diarrhea and vomiting.  Genitourinary:  Negative for flank pain.  Musculoskeletal:  Negative for back pain and neck pain.  Skin:  Positive for rash.  Neurological:  Negative for headaches.  Hematological:  Does not bruise/bleed easily.  Psychiatric/Behavioral:  Negative for confusion.     Physical Exam Updated Vital Signs BP (!) 127/93   Pulse 76   Temp 97.7 F (36.5 C)   Resp (!) 23   Ht 1.88 m ('6\' 2"'$ )   Wt 103.4 kg   SpO2 97%   BMI 29.27 kg/m  Physical Exam Vitals and nursing note reviewed.  Constitutional:      Appearance: Normal appearance. He is well-developed.  HENT:     Head: Atraumatic.     Nose: Nose normal.     Mouth/Throat:     Mouth: Mucous membranes are moist.     Pharynx: Oropharynx is clear.  Eyes:     General: No scleral icterus.    Conjunctiva/sclera: Conjunctivae normal.  Neck:     Trachea: No tracheal deviation.     Comments: No stiffness or rigidity.  Cardiovascular:     Rate  and Rhythm: Normal rate and regular rhythm.     Pulses: Normal pulses.     Heart sounds: Normal heart sounds. No murmur heard.    No friction rub. No gallop.  Pulmonary:     Effort: Pulmonary effort is normal. No accessory muscle usage or respiratory distress.     Breath sounds: Normal breath sounds. No stridor. No wheezing.  Abdominal:     General: Bowel sounds are normal. There is no distension.     Palpations: Abdomen is soft. There is no mass.     Tenderness: There is abdominal tenderness. There is no guarding or rebound.     Hernia: No hernia is present.     Comments: Epigastric tenderness.   Genitourinary:    Comments: No cva  tenderness. Musculoskeletal:        General: No swelling.     Cervical back: Normal range of motion and neck supple. No rigidity.     Right lower leg: No edema.     Left lower leg: No edema.  Skin:    General: Skin is warm and dry.     Findings: No rash.  Neurological:     Mental Status: He is alert.     Comments: Alert, speech clear.   Psychiatric:        Mood and Affect: Mood normal.     ED Results / Procedures / Treatments   Labs (all labs ordered are listed, but only abnormal results are displayed) Results for orders placed or performed during the hospital encounter of 01/01/22  CBC  Result Value Ref Range   WBC 9.0 4.0 - 10.5 K/uL   RBC 6.20 (H) 4.22 - 5.81 MIL/uL   Hemoglobin 19.2 (H) 13.0 - 17.0 g/dL   HCT 57.3 (H) 39.0 - 52.0 %   MCV 92.4 80.0 - 100.0 fL   MCH 31.0 26.0 - 34.0 pg   MCHC 33.5 30.0 - 36.0 g/dL   RDW 13.7 11.5 - 15.5 %   Platelets 339 150 - 400 K/uL   nRBC 0.0 0.0 - 0.2 %  Comprehensive metabolic panel  Result Value Ref Range   Sodium 136 135 - 145 mmol/L   Potassium 4.0 3.5 - 5.1 mmol/L   Chloride 103 98 - 111 mmol/L   CO2 24 22 - 32 mmol/L   Glucose, Bld 116 (H) 70 - 99 mg/dL   BUN 15 6 - 20 mg/dL   Creatinine, Ser 0.90 0.61 - 1.24 mg/dL   Calcium 9.0 8.9 - 10.3 mg/dL   Total Protein 7.7 6.5 - 8.1 g/dL   Albumin 4.1 3.5 - 5.0 g/dL   AST 21 15 - 41 U/L   ALT 26 0 - 44 U/L   Alkaline Phosphatase 75 38 - 126 U/L   Total Bilirubin 0.3 0.3 - 1.2 mg/dL   GFR, Estimated >60 >60 mL/min   Anion gap 9 5 - 15  Lipase, blood  Result Value Ref Range   Lipase 40 11 - 51 U/L  Troponin I (High Sensitivity)  Result Value Ref Range   Troponin I (High Sensitivity) <2 <18 ng/L   US Abdomen Limited RUQ (LIVER/GB)  Result Date: 01/01/2022 CLINICAL DATA:  Abdominal pain over the last week. EXAM: ULTRASOUND ABDOMEN LIMITED RIGHT UPPER QUADRANT COMPARISON:  None Available. FINDINGS: Gallbladder: No gallstones or wall thickening visualized. No sonographic  Murphy sign noted by sonographer. Common bile duct: Diameter: 2.8 mm, normal. Liver: No focal lesion identified. Within normal limits in parenchymal echogenicity. Portal vein  is patent on color Doppler imaging with normal direction of blood flow towards the liver. Other: None. IMPRESSION: Normal right upper quadrant ultrasound. No abnormality seen to explain pain. Electronically Signed   By: Nelson Chimes M.D.   On: 01/01/2022 09:29      EKG EKG Interpretation  Date/Time:  Monday January 01 2022 07:34:21 EDT Ventricular Rate:  72 PR Interval:  124 QRS Duration: 81 QT Interval:  400 QTC Calculation: 438 R Axis:   86 Text Interpretation: Sinus rhythm Nonspecific ST abnormality Confirmed by Lajean Saver [phone]) on 01/01/2022 7:47:15 AM  Radiology CT Abdomen Pelvis W Contrast  Result Date: 01/01/2022 CLINICAL DATA:  Abdominal pain EXAM: CT ABDOMEN AND PELVIS WITH CONTRAST TECHNIQUE: Multidetector CT imaging of the abdomen and pelvis was performed using the standard protocol following bolus administration of intravenous contrast. RADIATION DOSE REDUCTION: This exam was performed according to the departmental dose-optimization program which includes automated exposure control, adjustment of the mA and/or kV according to patient size and/or use of iterative reconstruction technique. CONTRAST:  137m OMNIPAQUE IOHEXOL 300 MG/ML  SOLN COMPARISON:  None Available. FINDINGS: Lower chest: No acute abnormality. Hepatobiliary: No focal liver abnormality is seen. No gallstones, gallbladder wall thickening, or biliary dilatation. Pancreas: Unremarkable. No pancreatic ductal dilatation or surrounding inflammatory changes. Spleen: Normal in size without focal abnormality. Adrenals/Urinary Tract: Adrenal glands are unremarkable. Kidneys are normal, without renal calculi, focal lesion, or hydronephrosis. Bladder is unremarkable. Stomach/Bowel: Stomach is within normal limits. Appendix appears normal. No evidence of  bowel wall thickening, distention, or inflammatory changes. Vascular/Lymphatic: No significant vascular findings are present. No enlarged abdominal or pelvic lymph nodes. Reproductive: Prostate is unremarkable. Other: No abdominal wall hernia or abnormality. No abdominopelvic ascites. Musculoskeletal: No acute or significant osseous findings. IMPRESSION: No acute findings in the abdomen or pelvis. Electronically Signed   By: LYetta GlassmanM.D.   On: 01/01/2022 10:57   UKoreaAbdomen Limited RUQ (LIVER/GB)  Result Date: 01/01/2022 CLINICAL DATA:  Abdominal pain over the last week. EXAM: ULTRASOUND ABDOMEN LIMITED RIGHT UPPER QUADRANT COMPARISON:  None Available. FINDINGS: Gallbladder: No gallstones or wall thickening visualized. No sonographic Murphy sign noted by sonographer. Common bile duct: Diameter: 2.8 mm, normal. Liver: No focal lesion identified. Within normal limits in parenchymal echogenicity. Portal vein is patent on color Doppler imaging with normal direction of blood flow towards the liver. Other: None. IMPRESSION: Normal right upper quadrant ultrasound. No abnormality seen to explain pain. Electronically Signed   By: MNelson ChimesM.D.   On: 01/01/2022 09:29    Procedures Procedures    Medications Ordered in ED Medications  methylPREDNISolone sodium succinate (SOLU-MEDROL) 125 mg/2 mL injection 125 mg (125 mg Intravenous Given 01/01/22 0837)  famotidine (PEPCID) IVPB 20 mg premix (20 mg Intravenous New Bag/Given 01/01/22 06045    ED Course/ Medical Decision Making/ A&P                           Medical Decision Making Problems Addressed: Allergic dermatitis: acute illness or injury that poses a threat to life or bodily functions Epigastric pain: acute illness or injury Polycythemia: chronic illness or injury  Amount and/or Complexity of Data Reviewed Independent Historian: spouse    Details: hx Labs: ordered. Decision-making details documented in ED Course. Radiology: ordered  and independent interpretation performed. Decision-making details documented in ED Course. ECG/medicine tests: ordered and independent interpretation performed. Decision-making details documented in ED Course.  Risk OTC drugs. Prescription drug management.  Decision regarding hospitalization.  Iv ns. Continuous pulse ox and cardiac monitoring. Labs ordered/sent. Imaging ordered.   Diff dx includes acute allergic rxn, pancreatitis, gastritis, gallstones/biliary colic, etc - dispo decision including potential need for admission if allergy symptoms/hives worsen, acute pancreatitis or other acute abn on imaging - will get labs and imaging and reassess.  Reviewed nursing notes and prior charts for additional history. External reports reviewed. Additional history from: spouse.   Solumedrol iv. Pepcid iv.   Cardiac monitor: sinus rhythm, rate 76.  Labs reviewed/interpreted by me - chem normal, lipase normal. Hgb/hct increased - also increased on prior labs (marked for charge merge, Hikaru Delorenzo, 2022).   U/S reviewed/interpreted by me - no gallstones. Ct reviewed/interpreted by me - no acute process.   Earlier symptoms improved. Abd soft nt.   Pt appears stable for d/c.   Rec pcp f/u.   Return precautions provided.            Final Clinical Impression(s) / ED Diagnoses Final diagnoses:  None    Rx / DC Orders ED Discharge Orders     None            Lajean Saver, MD 01/01/22 [phone]

## 2022-01-01 NOTE — ED Notes (Signed)
Patient transported to CT 

## 2022-01-01 NOTE — Discharge Instructions (Addendum)
It was our pleasure to provide your ER care today - we hope that you feel better.  Take benadryl 25 mg every 6 hours as need.   For upper abdominal pain, take protonix (acid blocker medication). You may also try maalox or pepcid for symptom relief. Follow up with primary care doctor in 1-2 weeks.   Follow up with allergist in the next 1-2 weeks.  From the lab tests, your blood count/hemoglobin is high (19.2) - drink plenty of fluids/stay well hydrated, avoid smoking, and follow up with primary care doctor in the next 1-2 weeks.   Return to ER right away  if worse, new symptoms, trouble breathing, new, worsening or severe abdominal pain, chest pain, trouble breathing, or other concern.   You were given pain meds in the ER - no driving for the next 6 hours.

## 2022-01-01 NOTE — ED Triage Notes (Signed)
Pt states he was on his way to work this morning and noticed he was breaking out in generalized itchy rash all over body.  Patient denies any SHOB or difficulty swallowing.  Patient took Benadryl '50mg'$  around 6:15am and reports the rash has improved some since the medication

## 2022-06-21 IMAGING — MR MR HIP*R* W/O CM
6 series · 40 of 40 positions shown · non-contrast
Comparison: None.

CLINICAL DATA: Right hip pain for 1 year.

EXAM:
MR OF THE RIGHT HIP WITHOUT CONTRAST
TECHNIQUE: Multiplanar, multisequence MR imaging was performed. No intravenous
contrast was administered.

[Series 8: T1 · coronal · right · 4.0mm · 0.74mm/px · 8 of 30 slices shown]
[im 1/30]
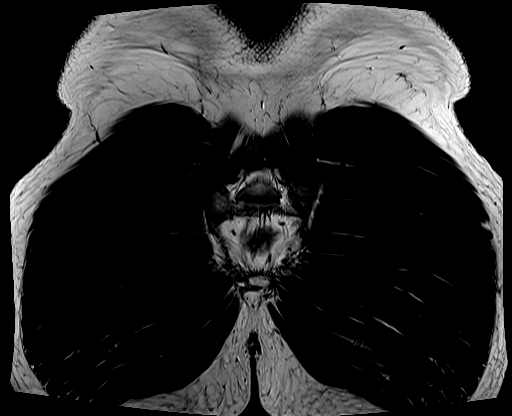
[im 5/30]
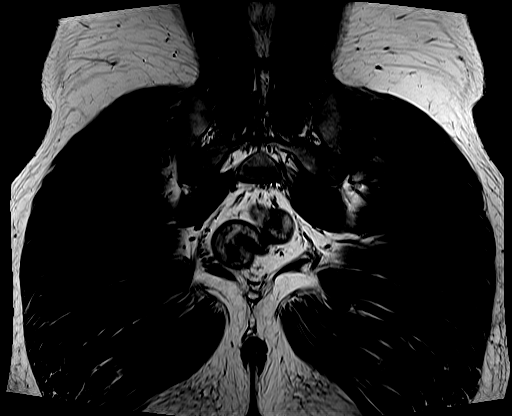
[im 9/30]
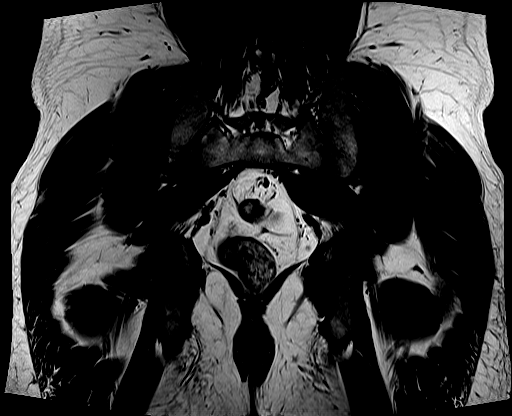
[im 13/30]
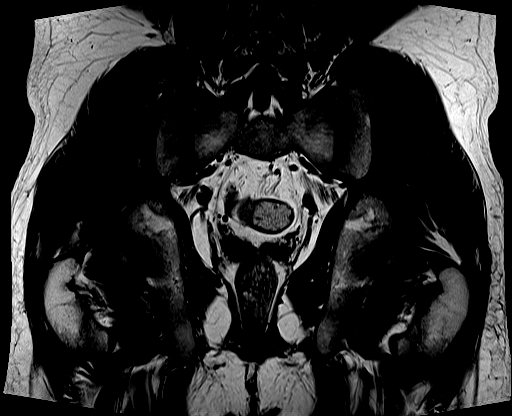
[im 17/30]
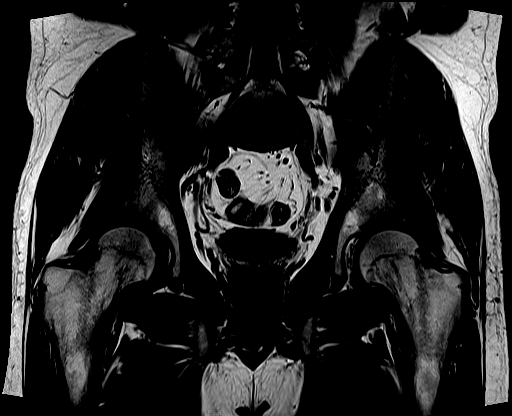
[im 21/30]
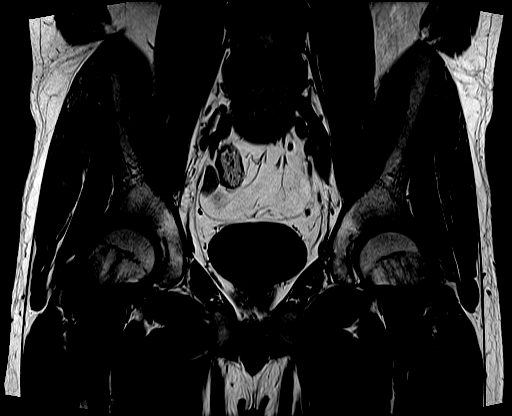
[im 25/30]
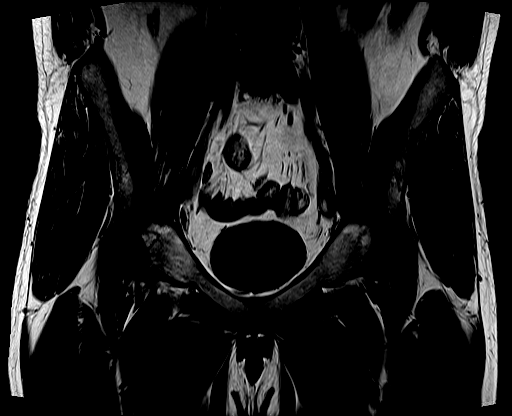
[im 30/30]
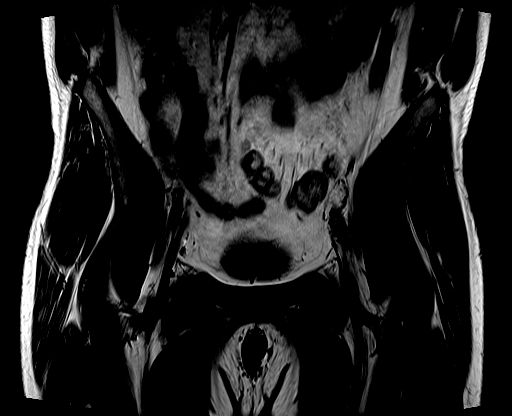

[Series 9: STIR · coronal · right · 4.0mm · 0.99mm/px · 8 of 30 slices shown]
[im 1/30]
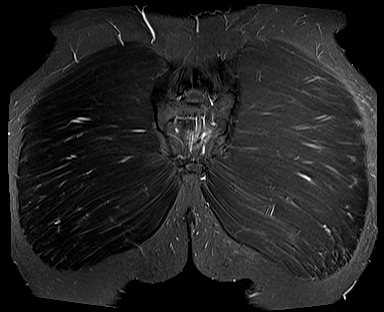
[im 5/30]
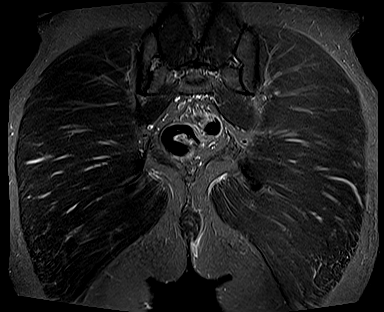
[im 9/30]
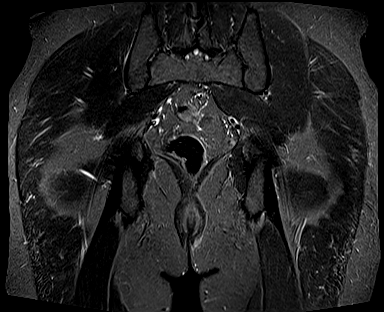
[im 13/30]
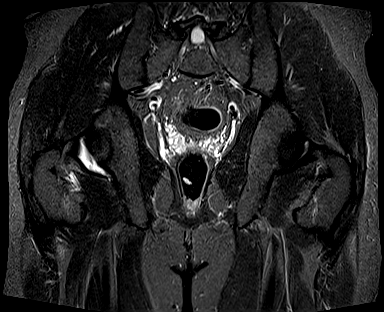
[im 17/30]
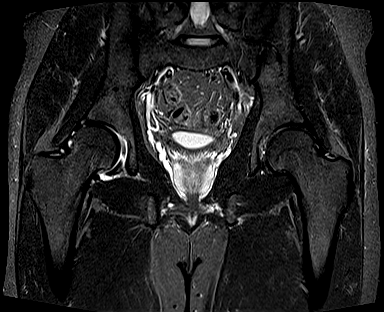
[im 21/30]
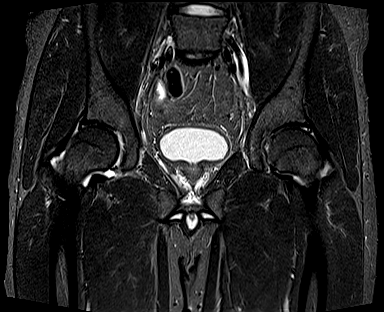
[im 25/30]
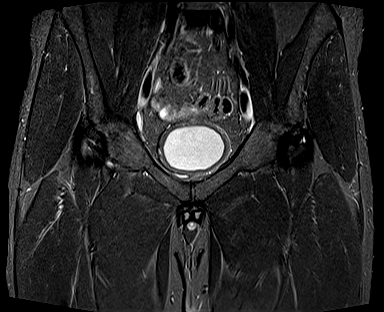
[im 30/30]
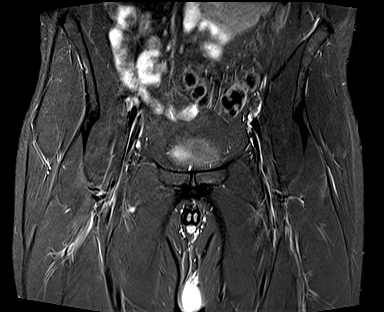

[Series 10: T2 fat-sat · axial · right · 4.0mm · 1.02mm/px · z∈[-52,+92]mm · 7 of 30 slices shown]
[im 1/30]
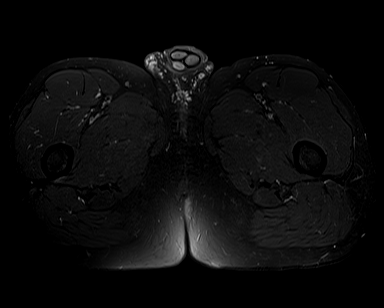
[im 5/30]
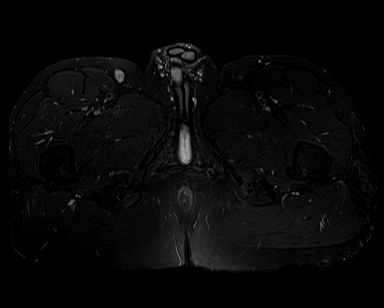
[im 10/30]
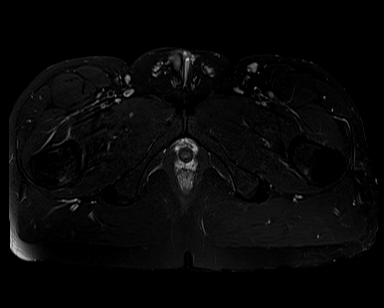
[im 15/30]
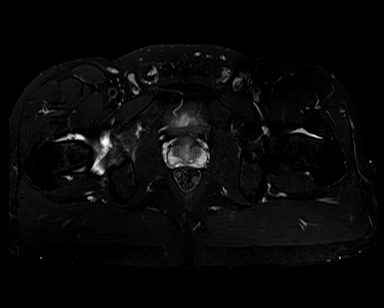
[im 20/30]
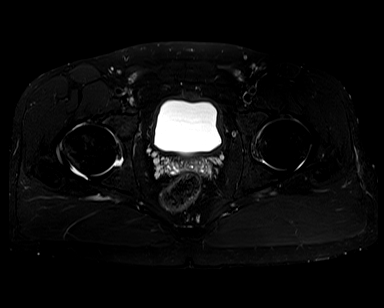
[im 25/30]
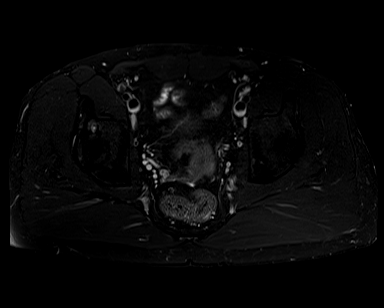
[im 30/30]
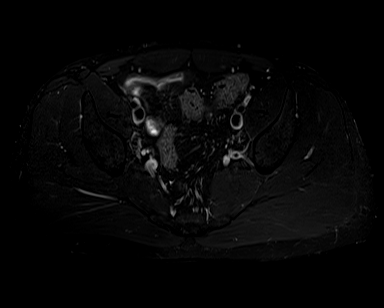

[Series 11: PD fat-sat · sagittal · right · 4.0mm · 0.78mm/px · 7 of 30 slices shown (1 of 3)]
[im 1/30]
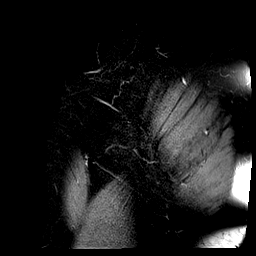
[im 5/30]
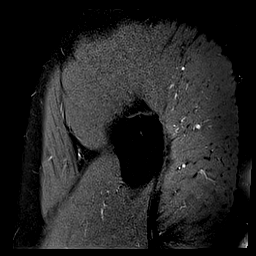
[im 10/30]
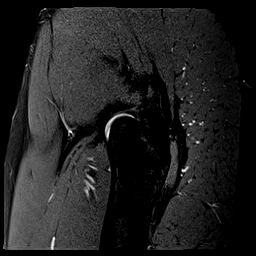
[im 15/30]
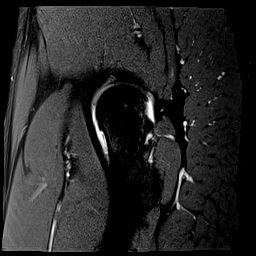
[im 20/30]
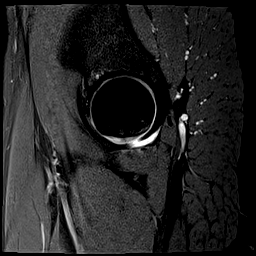
[im 25/30]
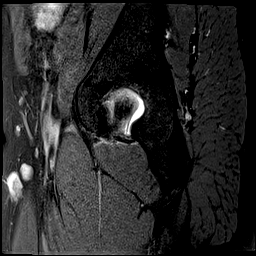
[im 30/30]
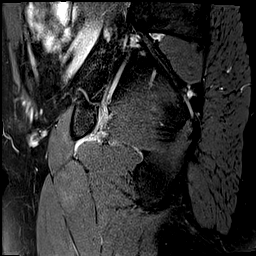

[Series 12: PD fat-sat · coronal · right · 4.0mm · 0.70mm/px · 5 of 20 slices shown (2 of 3)]
[im 1/20]
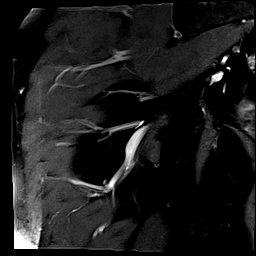
[im 5/20]
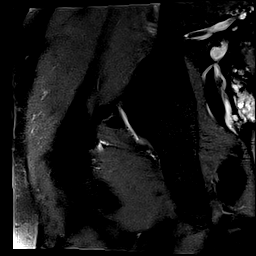
[im 10/20]
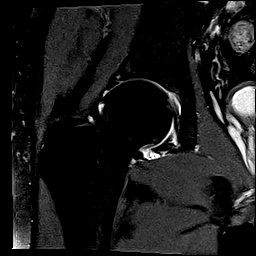
[im 15/20]
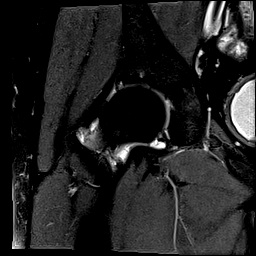
[im 20/20]
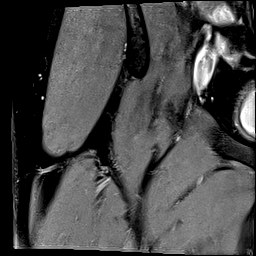

[Series 13: PD fat-sat · axial · right · 4.0mm · 0.69mm/px · z∈[-89,-5]mm · 5 of 21 slices shown (3 of 3)]
[im 1/21]
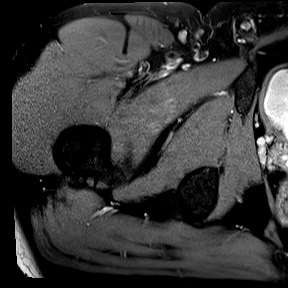
[im 6/21]
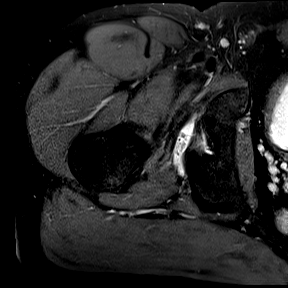
[im 11/21]
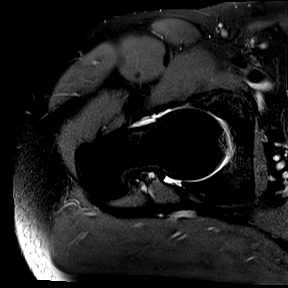
[im 16/21]
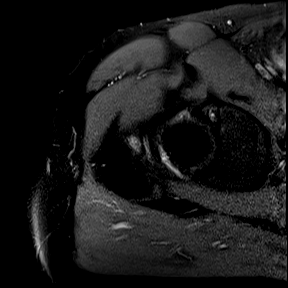
[im 21/21]
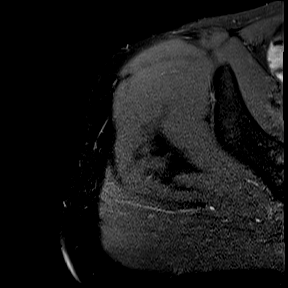

[40 of 40 positions shown; findings below may reference images not displayed]

FINDINGS: Bones:

No hip fracture, dislocation or avascular necrosis. No periosteal
reaction or bone destruction. No aggressive osseous lesion.

Normal sacrum and sacroiliac joints. No SI joint widening or erosive
changes.

Articular cartilage and labrum

Articular cartilage: Partial-thickness cartilage loss of the right
femoral head and acetabulum with mild subchondral edema at the
periphery of the superior acetabulum.

Labrum:  Extensive right anterior labral tear.

Joint or bursal effusion

Joint effusion: No SI joint effusion. Moderate right hip joint
effusion with synovitis. No left hip joint effusion. No left hip
joint effusion.

Bursae:  No bursa formation.

Muscles and tendons

Flexors: Normal.

Extensors: Normal.

Abductors: Normal.

Adductors: Normal.

Gluteals: Normal.

Hamstrings: Normal.

Other findings

No pelvic free fluid. No fluid collection or hematoma. No inguinal
lymphadenopathy. No inguinal hernia.
IMPRESSION: 1. Extensive right anterior labral tear.
2. Partial-thickness cartilage loss of the right femoral head and
acetabulum with mild subchondral edema at the periphery of the
superior acetabulum.
3. Moderate right hip joint effusion with synovitis.

## 2022-07-12 ENCOUNTER — Encounter: Payer: Self-pay | Admitting: Radiology

## 2022-08-25 ENCOUNTER — Emergency Department (HOSPITAL_COMMUNITY)
Admission: EM | Admit: 2022-08-25 | Discharge: 2022-08-25 | Disposition: A | Discharge status: Home / Self Care | Payer: BC Managed Care – PPO | Attending: Emergency Medicine | Admitting: Emergency Medicine

## 2022-08-25 ENCOUNTER — Encounter (HOSPITAL_COMMUNITY): Payer: Self-pay

## 2022-08-25 ENCOUNTER — Other Ambulatory Visit: Payer: Self-pay

## 2022-08-25 DIAGNOSIS — S86812A Strain of other muscle(s) and tendon(s) at lower leg level, left leg, initial encounter: Secondary | ICD-10-CM | POA: Diagnosis not present

## 2022-08-25 DIAGNOSIS — S8992XA Unspecified injury of left lower leg, initial encounter: Secondary | ICD-10-CM | POA: Diagnosis present

## 2022-08-25 DIAGNOSIS — X501XXA Overexertion from prolonged static or awkward postures, initial encounter: Secondary | ICD-10-CM | POA: Diagnosis not present

## 2022-08-25 NOTE — ED Triage Notes (Signed)
Pt arrived via POV from home c/o left calf pain/injury following an incident that occurred while golfing yesterday afternoon. Pt reports he went to jump [place] of a [place] on the [place], when he felt a ripping pain traveling from his lower left calf up to behind his knee. Pt ambulatory to treatment room from waiting area.

## 2022-08-25 NOTE — Discharge Instructions (Signed)
You were seen today with likely a strain of her left calf muscle.  Ice and elevate.  Take ibuprofen as needed for pain.  Avoid strenuous activity.  Follow-up with orthopedics.
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
